# Patient Record
Sex: Female | Born: 2000
Health system: Southern US, Community
[De-identification: ages and names within clinical notes are randomized; demographics above are authoritative.]

## PROBLEM LIST (undated history)

## (undated) HISTORY — PX: NO PAST SURGERIES: SHX2092

---
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
## 2022-08-13 ENCOUNTER — Other Ambulatory Visit: Payer: Self-pay | Admitting: Nurse Practitioner

## 2022-08-13 NOTE — Telephone Encounter (Signed)
Requested medication (s) are due for refill today:   Yes  Requested medication (s) are on the active medication list:   Yes  Future visit scheduled:   No   Last ordered: 09/10/2021 #84, 3 refills  Returned because there is a note this was discontinued 09/10/2021.    Wasn't sure if she is still on this or not.   Requested Prescriptions  Pending Prescriptions Disp Refills   drospirenone-ethinyl estradiol (YAZ) 3-0.02 MG tablet [Pharmacy Med Name: DROSPIRENONE/ETHY EST 3/0.02MG  T 28] 84 tablet 3    Sig: Take 1 tablet by mouth daily.     OB/GYN:  Contraceptives Failed - 08/13/2022  9:15 AM      Failed - Valid encounter within last 12 months    Recent Outpatient Visits           1 year ago Encounter for contraceptive management, unspecified type   [place], NP              Passed - Last BP in normal range    BP Readings from Last 1 Encounters:  06/19/21 113/73         Passed - Patient is not a smoker

## 2022-09-08 ENCOUNTER — Ambulatory Visit
Admission: EM | Admit: 2022-09-08 | Discharge: 2022-09-08 | Disposition: A | Discharge status: Home / Self Care | Payer: 59 | Attending: Urgent Care | Admitting: Urgent Care

## 2022-09-08 DIAGNOSIS — T148XXA Other injury of unspecified body region, initial encounter: Secondary | ICD-10-CM | POA: Diagnosis not present

## 2022-09-08 DIAGNOSIS — Z23 Encounter for immunization: Secondary | ICD-10-CM

## 2022-09-08 MED ORDER — TETANUS-DIPHTH-ACELL PERTUSSIS 5-2.5-18.5 LF-MCG/0.5 IM SUSY
0.5000 mL | PREFILLED_SYRINGE | Freq: Once | INTRAMUSCULAR | Status: AC
  Administered 2022-09-08: 0.5 mL via INTRAMUSCULAR

## 2022-09-08 NOTE — ED Triage Notes (Signed)
Patient presents to UC for a fall today that happen an hr ago. Has a  puncture to left sheen. Mom states does not known when last Tdap was given. Washed area with soap and water.

## 2022-09-08 NOTE — ED Provider Notes (Signed)
Renaldo Fiddler    CSN: [id number] Arrival date & time: 09/08/22  1642      History   Chief Complaint Chief Complaint  Patient presents with   Fall   Abrasion    HPI Elizabeth [name] is a 22 y.o. female.    Fall    Companied by her mom, presents to urgent care after a fall today.  She endorses puncture wound to left shin after she fell into a "pothole".  Does not know when last tetanus occurred.  Patient washed the area with soap and water.  History reviewed. No pertinent past medical history.  There are no problems to display for this patient.   Past Surgical History:  Procedure Laterality Date   NO PAST SURGERIES      OB History   No obstetric history on file.      Home Medications    Prior to Admission medications   Medication Sig Start Date End Date Taking? Authorizing Provider  norgestimate-ethinyl estradiol (SPRINTEC 28) 0.25-35 MG-MCG tablet Take 1 tablet by mouth daily. 09/10/21   Larae Grooms, NP    Family History Family History  Problem Relation Age of Onset   Hypertension Mother    Healthy Father     Social History Social History   Tobacco Use   Smoking status: Never   Smokeless tobacco: Never  Vaping Use   Vaping Use: Never used  Substance Use Topics   Alcohol use: Never   Drug use: Never     Allergies   Patient has no known allergies.   Review of Systems Review of Systems   Physical Exam Triage Vital Signs ED Triage Vitals  Enc Vitals Group     BP 09/08/22 1720 127/79     Pulse Rate 09/08/22 1720 72     Resp 09/08/22 1720 16     Temp 09/08/22 1720 99.3 F (37.4 C)     Temp Source 09/08/22 1720 Oral     SpO2 09/08/22 1720 97 %     Weight --      Height --      Head Circumference --      Peak Flow --      Pain Score 09/08/22 1704 0     Pain Loc --      Pain Edu? --      Excl. in GC? --    No data found.  Updated Vital Signs BP 127/79 (BP Location: Left Arm)   Pulse 72   Temp 99.3 F (37.4 C)  (Oral)   Resp 16   LMP 08/18/2022 (Approximate)   SpO2 97%   Visual Acuity Right Eye Distance:   Left Eye Distance:   Bilateral Distance:    Right Eye Near:   Left Eye Near:    Bilateral Near:     Physical Exam Vitals reviewed.  Constitutional:      Appearance: Normal appearance.  Musculoskeletal:       Legs:  Skin:    General: Skin is warm and dry.  Neurological:     General: No focal deficit present.     Mental Status: She is alert and oriented to person, place, and time.  Psychiatric:        Mood and Affect: Mood normal.        Behavior: Behavior normal.      UC Treatments / Results  Labs (all labs ordered are listed, but only abnormal results are displayed) Labs Reviewed - No data to display  EKG   Radiology No results found.  Procedures Wound Care  Date/Time: 09/08/2022 6:24 PM  Performed by: Charma Igo, FNP Authorized by: Charma Igo, FNP   Consent:    Consent obtained:  Verbal   Consent given by:  Patient   Risks, benefits, and alternatives were discussed: yes     Risks discussed:  Pain   Alternatives discussed:  No treatment Universal protocol:    Procedure explained and questions answered to patient or proxy's satisfaction: yes     Relevant documents present and verified: yes     Test results available: no     Imaging studies available: no     Required blood products, implants, devices, and special equipment available: no     Site/side marked: no     Immediately prior to procedure, a time out was called: yes     Patient identity confirmed:  Verbally with patient Anesthesia:    Anesthesia method:  Topical application   Topical anesthetic:  LET Procedure details:    Indications: open wounds     Wound location:  Leg   Leg location:  L lower leg   Wound age (days):  <1   Wound surface area (sq cm):  1   Debridement performed: No   Skin layer closed with:    Wound care performed:  Nothing Dressing:    Dressing applied:   2x2 and Vaseline gauze   Wrapped with:  Conform bandage 3 inch Post-procedure details:    Procedure completion:  Tolerated Comments:     Wound irrigated with 20 ml NS.  (including critical care time)  Medications Ordered in UC Medications  Tdap (BOOSTRIX) injection 0.5 mL (0.5 mLs Intramuscular Given 09/08/22 1808)    Initial Impression / Assessment and Plan / UC Course  I have reviewed the triage vital signs and the nursing notes.  Pertinent labs & imaging results that were available during my care of the patient were reviewed by me and considered in my medical decision making (see chart for details).   Wound cleaned and dressed.  See procedure note.  Instructions for wound care provided to patient and mom.   Final Clinical Impressions(s) / UC Diagnoses   Final diagnoses:  Puncture wound   Discharge Instructions   None    ED Prescriptions   None    PDMP not reviewed this encounter.   Charma Igo, [place] 09/08/22 1826

## 2022-09-08 NOTE — Discharge Instructions (Addendum)
Formal wound care as described in clinic.  Watch for signs and symptoms of infection.  Follow up here or with your primary care provider if your symptoms are worsening or not improving.

## 2022-11-02 ENCOUNTER — Telehealth: Payer: Self-pay

## 2022-11-02 ENCOUNTER — Other Ambulatory Visit: Payer: Self-pay | Admitting: Nurse Practitioner

## 2022-11-02 NOTE — Telephone Encounter (Signed)
LVM for patient to call back [phone], or to call PCP office to schedule follow up apt. AS, CMA  

## 2022-11-03 NOTE — Telephone Encounter (Signed)
Requested medication (s) are due for refill today -expired Rx  Requested medication (s) are on the active medication list -yes  Future visit scheduled -no  Last refill: 09/10/21  Notes to clinic: expired Rx- attempted to contact patient for appointment- left message to call office.   Requested Prescriptions  Pending Prescriptions Disp Refills   MILI 0.25-35 MG-MCG tablet [Pharmacy Med Name: MILI TABLETS 28S] 84 tablet 3    Sig: TAKE 1 TABLET BY MOUTH DAILY     OB/GYN:  Contraceptives Failed - 11/02/2022  4:21 PM      Failed - Valid encounter within last 12 months    Recent Outpatient Visits           1 year ago Encounter for contraceptive management, unspecified type   [place] [facility] Larae Grooms, NP              Passed - Last BP in normal range    BP Readings from Last 1 Encounters:  09/08/22 127/79         Passed - Patient is not a smoker         Requested Prescriptions  Pending Prescriptions Disp Refills   MILI 0.25-35 MG-MCG tablet [Pharmacy Med Name: MILI TABLETS 28S] 84 tablet 3    Sig: TAKE 1 TABLET BY MOUTH DAILY     OB/GYN:  Contraceptives Failed - 11/02/2022  4:21 PM      Failed - Valid encounter within last 12 months    Recent Outpatient Visits           1 year ago Encounter for contraceptive management, unspecified type   [place] [facility] Larae Grooms, NP              Passed - Last BP in normal range    BP Readings from Last 1 Encounters:  09/08/22 127/79         Passed - Patient is not a smoker

## 2022-11-04 ENCOUNTER — Ambulatory Visit (INDEPENDENT_AMBULATORY_CARE_PROVIDER_SITE_OTHER): Payer: 59 | Admitting: Nurse Practitioner

## 2022-11-04 ENCOUNTER — Encounter: Payer: Self-pay | Admitting: Nurse Practitioner

## 2022-11-04 VITALS — BP 126/79 | HR 74 | Temp 97.9°F | Wt 172.4 lb

## 2022-11-04 DIAGNOSIS — Z23 Encounter for immunization: Secondary | ICD-10-CM | POA: Diagnosis not present

## 2022-11-04 DIAGNOSIS — Z0000 Encounter for general adult medical examination without abnormal findings: Secondary | ICD-10-CM | POA: Diagnosis not present

## 2022-11-04 MED ORDER — NORGESTIMATE-ETH ESTRADIOL 0.25-35 MG-MCG PO TABS: 1.0000 | ORAL_TABLET | Freq: Every day | ORAL | 3 refills | Status: DC

## 2022-11-04 NOTE — Progress Notes (Signed)
BP 126/79   Pulse 74   Temp 97.9 F (36.6 C) (Oral)   Wt 172 lb 6.4 oz (78.2 kg)   LMP 10/26/2022 (Approximate)   SpO2 100%   BMI 24.46 kg/m    Subjective:    Patient ID: Elizabeth [name], female    DOB: [birth year], 22 y.o.   MRN: [id number]  HPI: Elizabeth [name] is a 22 y.o. female presenting on 11/04/2022 for comprehensive medical examination. Current medical complaints include:none  She currently lives with: Menopausal Symptoms: no   Denies HA, CP, SOB, dizziness, palpitations, visual changes, and lower extremity swelling.  Depression Screen done today and results listed below:     11/04/2022    3:51 PM 06/19/2021    1:35 PM  Depression screen PHQ 2/9  Decreased Interest 0 0  Down, Depressed, Hopeless 0 0  PHQ - 2 Score 0 0  Altered sleeping 0 0  Tired, decreased energy 0 0  Change in appetite 0 0  Feeling bad or failure about yourself  0 0  Trouble concentrating 0 0  Moving slowly or fidgety/restless 0 0  Suicidal thoughts 0 0  PHQ-9 Score 0 0  Difficult doing work/chores Not difficult at all Not difficult at all    The patient does not have a history of falls. I did complete a risk assessment for falls. A plan of care for falls was documented.   Past Medical History:  History reviewed. No pertinent past medical history.  Surgical History:  Past Surgical History:  Procedure Laterality Date   NO PAST SURGERIES      Medications:  No current outpatient medications on file prior to visit.   No current facility-administered medications on file prior to visit.    Allergies:  No Known Allergies  Social History:  Social History   Socioeconomic History   Marital status: Single    Spouse name: Not on file   Number of children: Not on file   Years of education: Not on file   Highest education level: Not on file  Occupational History   Not on file  Tobacco Use   Smoking status: Never   Smokeless tobacco: Never  Vaping Use   Vaping Use: Never used   Substance and Sexual Activity   Alcohol use: Never   Drug use: Never   Sexual activity: Yes  Other Topics Concern   Not on file  Social History Narrative   Not on file   Social Determinants of Health   Financial Resource Strain: Not on file  Food Insecurity: Not on file  Transportation Needs: Not on file  Physical Activity: Not on file  Stress: Not on file  Social Connections: Not on file  Intimate Partner Violence: Not on file   Social History   Tobacco Use  Smoking Status Never  Smokeless Tobacco Never   Social History   Substance and Sexual Activity  Alcohol Use Never    Family History:  Family History  Problem Relation Age of Onset   Hypertension Mother    Healthy Father     Past medical history, surgical history, medications, allergies, family history and social history reviewed with patient today and changes made to appropriate areas of the chart.   Review of Systems  Eyes:  Negative for blurred vision and double vision.  Respiratory:  Negative for shortness of breath.   Cardiovascular:  Negative for chest pain, palpitations and leg swelling.  Neurological:  Negative for dizziness and headaches.   All  other ROS negative except what is listed above and in the HPI.      Objective:    BP 126/79   Pulse 74   Temp 97.9 F (36.6 C) (Oral)   Wt 172 lb 6.4 oz (78.2 kg)   LMP 10/26/2022 (Approximate)   SpO2 100%   BMI 24.46 kg/m   Wt Readings from Last 3 Encounters:  11/04/22 172 lb 6.4 oz (78.2 kg) (92 %, Z= 1.43)*  06/19/21 163 lb 12.8 oz (74.3 kg) (91 %, Z= 1.32)*  05/24/19 141 lb 3.2 oz (64 kg) (80 %, Z= 0.85)*   * Growth percentiles are based on CDC (Girls, 2-20 Years) data.    Physical Exam Vitals and nursing note reviewed.  Constitutional:      General: She is awake. She is not in acute distress.    Appearance: Normal appearance. She is well-developed. She is not ill-appearing.  HENT:     Head: Normocephalic and atraumatic.     Right  Ear: Hearing, tympanic membrane, ear canal and external ear normal. No drainage.     Left Ear: Hearing, tympanic membrane, ear canal and external ear normal. No drainage.     Nose: Nose normal.     Right Sinus: No maxillary sinus tenderness or frontal sinus tenderness.     Left Sinus: No maxillary sinus tenderness or frontal sinus tenderness.     Mouth/Throat:     Mouth: Mucous membranes are moist.     Pharynx: Oropharynx is clear. Uvula midline. No pharyngeal swelling, oropharyngeal exudate or posterior oropharyngeal erythema.  Eyes:     General: Lids are normal.        Right eye: No discharge.        Left eye: No discharge.     Extraocular Movements: Extraocular movements intact.     Conjunctiva/sclera: Conjunctivae normal.     Pupils: Pupils are equal, round, and reactive to light.     Visual Fields: Right eye visual fields normal and left eye visual fields normal.  Neck:     Thyroid: No thyromegaly.     Vascular: No carotid bruit.     Trachea: Trachea normal.  Cardiovascular:     Rate and Rhythm: Normal rate and regular rhythm.     Heart sounds: Normal heart sounds. No murmur heard.    No gallop.  Pulmonary:     Effort: Pulmonary effort is normal. No accessory muscle usage or respiratory distress.     Breath sounds: Normal breath sounds.  Chest:  Breasts:    Right: Normal.     Left: Normal.  Abdominal:     General: Bowel sounds are normal.     Palpations: Abdomen is soft. There is no hepatomegaly or splenomegaly.     Tenderness: There is no abdominal tenderness.  Musculoskeletal:        General: Normal range of motion.     Cervical back: Normal range of motion and neck supple.     Right lower leg: No edema.     Left lower leg: No edema.  Lymphadenopathy:     Head:     Right side of head: No submental, submandibular, tonsillar, preauricular or posterior auricular adenopathy.     Left side of head: No submental, submandibular, tonsillar, preauricular or posterior  auricular adenopathy.     Cervical: No cervical adenopathy.     Upper Body:     Right upper body: No supraclavicular, axillary or pectoral adenopathy.     Left upper body: No  supraclavicular, axillary or pectoral adenopathy.  Skin:    General: Skin is warm and dry.     Capillary Refill: Capillary refill takes less than 2 seconds.     Findings: No rash.  Neurological:     Mental Status: She is alert and oriented to person, place, and time.     Gait: Gait is intact.  Psychiatric:        Attention and Perception: Attention normal.        Mood and Affect: Mood normal.        Speech: Speech normal.        Behavior: Behavior normal. Behavior is cooperative.        Thought Content: Thought content normal.        Judgment: Judgment normal.     Results for orders placed or performed in visit on 06/19/21  Pregnancy, urine  Result Value Ref Range   Preg Test, Ur Negative Negative      Assessment & Plan:   Problem List Items Addressed This Visit   None Visit Diagnoses     Annual physical exam    -  Primary   Health maintenance reviewed during visit today.  Reviewed vaccines. HPV given.  TDAP up to date.   Need for HPV vaccination       Relevant Orders   HPV 9-valent vaccine,Recombinat        Follow up plan: Return in about 1 year (around 11/04/2023) for Physical and Fasting labs.   LABORATORY TESTING:  - Pap smear: not applicable  IMMUNIZATIONS:   - Tdap: Tetanus vaccination status reviewed: last tetanus booster within 10 years. - Influenza: Postponed to flu season - Pneumovax: Not applicable - Prevnar: Not applicable - COVID: Not applicable - HPV: Administered today - Shingrix vaccine: Not applicable  SCREENING: -Mammogram: Not applicable  - Colonoscopy: Not applicable  - Bone Density: Not applicable  -Hearing Test: Not applicable  -Spirometry: Not applicable   PATIENT COUNSELING:   Advised to take 1 mg of folate supplement per day if capable of pregnancy.    Sexuality: Discussed sexually transmitted diseases, partner selection, use of condoms, avoidance of unintended pregnancy  and contraceptive alternatives.   Advised to avoid cigarette smoking.  I discussed with the patient that most people either abstain from alcohol or drink within safe limits (<=14/week and <=4 drinks/occasion for males, <=7/weeks and <= 3 drinks/occasion for females) and that the risk for alcohol disorders and other health effects rises proportionally with the number of drinks per week and how often a drinker exceeds daily limits.  Discussed cessation/primary prevention of drug use and availability of treatment for abuse.   Diet: Encouraged to adjust caloric intake to maintain  or achieve ideal body weight, to reduce intake of dietary saturated fat and total fat, to limit sodium intake by avoiding high sodium foods and not adding table salt, and to maintain adequate dietary potassium and calcium preferably from fresh fruits, vegetables, and low-fat dairy products.    stressed the importance of regular exercise  Injury prevention: Discussed safety belts, safety helmets, smoke detector, smoking near bedding or upholstery.   Dental health: Discussed importance of regular tooth brushing, flossing, and dental visits.    NEXT PREVENTATIVE PHYSICAL DUE IN 1 YEAR. Return in about 1 year (around 11/04/2023) for Physical and Fasting labs.

## 2023-02-25 ENCOUNTER — Ambulatory Visit
Admission: EM | Admit: 2023-02-25 | Discharge: 2023-02-25 | Disposition: A | Discharge status: Home / Self Care | Payer: 59 | Attending: Emergency Medicine | Admitting: Emergency Medicine

## 2023-02-25 DIAGNOSIS — R1030 Lower abdominal pain, unspecified: Secondary | ICD-10-CM | POA: Diagnosis not present

## 2023-02-25 DIAGNOSIS — Z113 Encounter for screening for infections with a predominantly sexual mode of transmission: Secondary | ICD-10-CM

## 2023-02-25 DIAGNOSIS — R300 Dysuria: Secondary | ICD-10-CM | POA: Diagnosis not present

## 2023-02-25 LAB — POCT URINALYSIS DIP (MANUAL ENTRY)
Bilirubin, UA: NEGATIVE
Glucose, UA: NEGATIVE mg/dL
Ketones, POC UA: NEGATIVE mg/dL
Nitrite, UA: NEGATIVE
Protein Ur, POC: NEGATIVE mg/dL
Spec Grav, UA: 1.02 (ref 1.010–1.025)
Urobilinogen, UA: 0.2 U/dL
pH, UA: 6 (ref 5.0–8.0)

## 2023-02-25 LAB — POCT URINE PREGNANCY: Preg Test, Ur: NEGATIVE

## 2023-02-25 MED ORDER — CEPHALEXIN 500 MG PO CAPS: 500.0000 mg | ORAL_CAPSULE | Freq: Two times a day (BID) | ORAL | 0 refills | Status: AC

## 2023-02-25 NOTE — Discharge Instructions (Addendum)
Take the antibiotic as directed.  The urine culture is pending.  We will call you if it shows the need to change or discontinue your antibiotic.    Your vaginal tests are pending.  If your test results are positive, we will call you.  You may require treatment at that time.  Do not have sexual activity for at least 7 days.    Follow up with your primary care provider if your symptoms are not improving.

## 2023-02-25 NOTE — ED Triage Notes (Signed)
Pt states she is having urinary frequency, burning with urination, lower abdominal cramping that started Sunday. Taking cranberry pills with some relief of symptoms.

## 2023-02-25 NOTE — ED Provider Notes (Signed)
Renaldo Fiddler    CSN: [id number] Arrival date & time: 02/25/23  1648      History   Chief Complaint Chief Complaint  Patient presents with   Urinary Frequency    HPI Elizabeth [name] is a 22 y.o. female.  Patient presents with 5-day history of dysuria, urinary frequency, lower abdominal pain.  Treating with cranberry pills.  Patient requests urine check and STD testing.  She is unsure about vaginal discharge as she is currently on her menstrual cycle.  She is sexually active.  She denies fever, rash, vomiting, diarrhea, flank pain, pelvic pain, or other symptoms.  She denies pertinent medical history.  The history is provided by the patient and medical records.    History reviewed. No pertinent past medical history.  There are no problems to display for this patient.   Past Surgical History:  Procedure Laterality Date   NO PAST SURGERIES      OB History   No obstetric history on file.      Home Medications    Prior to Admission medications   Medication Sig Start Date End Date Taking? Authorizing Provider  cephALEXin (KEFLEX) 500 MG capsule Take 1 capsule (500 mg total) by mouth 2 (two) times daily for 5 days. 02/25/23 03/02/23 Yes Mickie Bail, NP  norgestimate-ethinyl estradiol (SPRINTEC 28) 0.25-35 MG-MCG tablet Take 1 tablet by mouth daily. 11/04/22  Yes Larae Grooms, NP    Family History Family History  Problem Relation Age of Onset   Hypertension Mother    Healthy Father     Social History Social History   Tobacco Use   Smoking status: Never   Smokeless tobacco: Never  Vaping Use   Vaping status: Never Used  Substance Use Topics   Alcohol use: Never   Drug use: Never     Allergies   Patient has no known allergies.   Review of Systems Review of Systems  Constitutional:  Negative for chills and fever.  Gastrointestinal:  Positive for abdominal pain. Negative for constipation, diarrhea, nausea and vomiting.  Genitourinary:   Positive for dysuria and frequency. Negative for flank pain, hematuria, pelvic pain and vaginal discharge.  Skin:  Negative for color change and rash.     Physical Exam Triage Vital Signs ED Triage Vitals  Encounter Vitals Group     BP 02/25/23 1730 137/70     Systolic BP Percentile --      Diastolic BP Percentile --      Pulse Rate 02/25/23 1730 88     Resp 02/25/23 1730 16     Temp 02/25/23 1730 98.2 F (36.8 C)     Temp Source 02/25/23 1730 Oral     SpO2 02/25/23 1730 98 %     Weight --      Height --      Head Circumference --      Peak Flow --      Pain Score 02/25/23 1731 0     Pain Loc --      Pain Education --      Exclude from Growth Chart --    No data found.  Updated Vital Signs BP 137/70 (BP Location: Left Arm)   Pulse 88   Temp 98.2 F (36.8 C) (Oral)   Resp 16   LMP 02/25/2023 (Exact Date)   SpO2 98%   Visual Acuity Right Eye Distance:   Left Eye Distance:   Bilateral Distance:    Right Eye Near:  Left Eye Near:    Bilateral Near:     Physical Exam Constitutional:      General: She is not in acute distress. HENT:     Mouth/Throat:     Mouth: Mucous membranes are moist.  Cardiovascular:     Rate and Rhythm: Normal rate and regular rhythm.  Pulmonary:     Effort: Pulmonary effort is normal. No respiratory distress.  Abdominal:     General: Bowel sounds are normal.     Palpations: Abdomen is soft.     Tenderness: There is no abdominal tenderness. There is no right CVA tenderness, left CVA tenderness, guarding or rebound.  Skin:    General: Skin is warm and dry.  Neurological:     Mental Status: She is alert.  Psychiatric:        Mood and Affect: Mood normal.        Behavior: Behavior normal.      UC Treatments / Results  Labs (all labs ordered are listed, but only abnormal results are displayed) Labs Reviewed  POCT URINALYSIS DIP (MANUAL ENTRY) - Abnormal; Notable for the following components:      Result Value   Clarity, UA  cloudy (*)    Blood, UA large (*)    Leukocytes, UA Small (1+) (*)    All other components within normal limits  URINE CULTURE  POCT URINE PREGNANCY  CERVICOVAGINAL ANCILLARY ONLY    EKG   Radiology No results found.  Procedures Procedures (including critical care time)  Medications Ordered in UC Medications - No data to display  Initial Impression / Assessment and Plan / UC Course  I have reviewed the triage vital signs and the nursing notes.  Pertinent labs & imaging results that were available during my care of the patient were reviewed by me and considered in my medical decision making (see chart for details).    Dysuria, lower abdominal pain, STD screening.  Treating with Keflex. Urine culture pending. Discussed with patient that we will call her if the urine culture shows the need to change or discontinue the antibiotic.  Patient obtained vaginal self swab for testing.  Discussed that we will call if test results are positive and that she may require treatment at that time.  Instructed patient to abstain from sexual activity for at least 7 days.  Instructed her to follow-up with her PCP if her symptoms are not improving.  Patient agrees to plan of care.      Final Clinical Impressions(s) / UC Diagnoses   Final diagnoses:  Screening for STD (sexually transmitted disease)  Dysuria  Lower abdominal pain     Discharge Instructions      Take the antibiotic as directed.  The urine culture is pending.  We will call you if it shows the need to change or discontinue your antibiotic.    Your vaginal tests are pending.  If your test results are positive, we will call you.  You may require treatment at that time.  Do not have sexual activity for at least 7 days.    Follow-up with your primary care provider if your symptoms are not improving.        ED Prescriptions     Medication Sig Dispense Auth. Provider   cephALEXin (KEFLEX) 500 MG capsule Take 1 capsule (500 mg  total) by mouth 2 (two) times daily for 5 days. 10 capsule Mickie Bail, NP      PDMP not reviewed this encounter.  Mickie Bail, NP 02/25/23 1754

## 2023-02-26 LAB — CERVICOVAGINAL ANCILLARY ONLY
Bacterial Vaginitis (gardnerella): NEGATIVE
Candida Glabrata: NEGATIVE
Candida Vaginitis: NEGATIVE
Chlamydia: NEGATIVE
Comment: NEGATIVE
Comment: NEGATIVE
Comment: NEGATIVE
Comment: NEGATIVE
Comment: NEGATIVE
Comment: NORMAL
Neisseria Gonorrhea: NEGATIVE
Trichomonas: NEGATIVE

## 2023-02-28 LAB — URINE CULTURE: Culture: >=100000 — AB

## 2023-11-02 ENCOUNTER — Other Ambulatory Visit: Payer: Self-pay | Admitting: Nurse Practitioner

## 2023-11-04 NOTE — Telephone Encounter (Signed)
 Requested Prescriptions  Pending Prescriptions Disp Refills   norgestimate -ethinyl estradiol  (MILI) 0.25-35 MG-MCG tablet [Pharmacy Med Name: MILI TABLETS 28S] 84 tablet 0    Sig: TAKE 1 TABLET BY MOUTH DAILY     OB/GYN:  Contraceptives Failed - 11/04/2023  3:55 PM      Failed - Valid encounter within last 12 months    Recent Outpatient Visits   None     Future Appointments             In 4 days Aileen Alexanders, NP [facility]            Passed - Last BP in normal range    BP Readings from Last 1 Encounters:  02/25/23 137/70         Passed - Patient is not a smoker

## 2023-11-08 ENCOUNTER — Encounter: Payer: Self-pay | Admitting: Nurse Practitioner

## 2023-11-08 NOTE — Progress Notes (Deleted)
 There were no vitals taken for this visit.   Subjective:    Patient ID: Elizabeth [name], female    DOB: 10-29-2000, 23 y.o.   MRN: [id number]  HPI: JURLINE FOLGER is a 23 y.o. female presenting on 11/08/2023 for comprehensive medical examination. Current medical complaints include:{Blank single:19197::none,***}  She currently lives with: Menopausal Symptoms: {Blank single:19197::yes,no}  Depression Screen done today and results listed below:     11/04/2022    3:51 PM 06/19/2021    1:35 PM  Depression screen PHQ 2/9  Decreased Interest 0 0  Down, Depressed, Hopeless 0 0  PHQ - 2 Score 0 0  Altered sleeping 0 0  Tired, decreased energy 0 0  Change in appetite 0 0  Feeling bad or failure about yourself  0 0  Trouble concentrating 0 0  Moving slowly or fidgety/restless 0 0  Suicidal thoughts 0 0  PHQ-9 Score 0 0  Difficult doing work/chores Not difficult at all Not difficult at all    The patient {has/does not have:19849} a history of falls. I {did/did not:19850} complete a risk assessment for falls. A plan of care for falls {was/was not:19852} documented.   Past Medical History:  No past medical history on file.  Surgical History:  Past Surgical History:  Procedure Laterality Date   NO PAST SURGERIES      Medications:  Current Outpatient Medications on File Prior to Visit  Medication Sig   norgestimate -ethinyl estradiol  (MILI) 0.25-35 MG-MCG tablet TAKE 1 TABLET BY MOUTH DAILY   No current facility-administered medications on file prior to visit.    Allergies:  No Known Allergies  Social History:  Social History   Socioeconomic History   Marital status: Single    Spouse name: Not on file   Number of children: Not on file   Years of education: Not on file   Highest education level: Not on file  Occupational History   Not on file  Tobacco Use   Smoking status: Never   Smokeless tobacco: Never  Vaping Use   Vaping status: Never Used  Substance and  Sexual Activity   Alcohol use: Never   Drug use: Never   Sexual activity: Yes  Other Topics Concern   Not on file  Social History Narrative   Not on file   Social Drivers of Health   Financial Resource Strain: Not on file  Food Insecurity: Not on file  Transportation Needs: Not on file  Physical Activity: Not on file  Stress: Not on file  Social Connections: Not on file  Intimate Partner Violence: Not on file   Social History   Tobacco Use  Smoking Status Never  Smokeless Tobacco Never   Social History   Substance and Sexual Activity  Alcohol Use Never    Family History:  Family History  Problem Relation Age of Onset   Hypertension Mother    Healthy Father     Past medical history, surgical history, medications, allergies, family history and social history reviewed with patient today and changes made to appropriate areas of the chart.   ROS All other ROS negative except what is listed above and in the HPI.      Objective:    There were no vitals taken for this visit.  Wt Readings from Last 3 Encounters:  11/04/22 172 lb 6.4 oz (78.2 kg) (92%, Z= 1.43)*  06/19/21 163 lb 12.8 oz (74.3 kg) (91%, Z= 1.32)*  05/24/19 141 lb 3.2 oz (64 kg) (80%,  Z= 0.85)*   * Growth percentiles are based on CDC (Girls, 2-20 Years) data.    Physical Exam  Results for orders placed or performed during the hospital encounter of 02/25/23  POCT urinalysis dipstick   Collection Time: 02/25/23  5:37 PM  Result Value Ref Range   Color, UA yellow yellow   Clarity, UA cloudy (A) clear   Glucose, UA negative negative mg/dL   Bilirubin, UA negative negative   Ketones, POC UA negative negative mg/dL   Spec Grav, UA 8.979 8.989 - 1.025   Blood, UA large (A) negative   pH, UA 6.0 5.0 - 8.0   Protein Ur, POC negative negative mg/dL   Urobilinogen, UA 0.2 0.2 or 1.0 E.U./dL   Nitrite, UA Negative Negative   Leukocytes, UA Small (1+) (A) Negative  POCT urine pregnancy   Collection  Time: 02/25/23  5:37 PM  Result Value Ref Range   Preg Test, Ur Negative Negative  Cervicovaginal ancillary only   Collection Time: 02/25/23  5:38 PM  Result Value Ref Range   Neisseria Gonorrhea Negative    Chlamydia Negative    Trichomonas Negative    Bacterial Vaginitis (gardnerella) Negative    Candida Vaginitis Negative    Candida Glabrata Negative    Comment      Normal Reference Range Bacterial Vaginosis - Negative   Comment Normal Reference Range Candida Species - Negative    Comment Normal Reference Range Candida Galbrata - Negative    Comment Normal Reference Range Trichomonas - Negative    Comment Normal Reference Ranger Chlamydia - Negative    Comment      Normal Reference Range Neisseria Gonorrhea - Negative  Urine Culture   Collection Time: 02/25/23  5:43 PM   Specimen: Urine, Clean Catch  Result Value Ref Range   Specimen Description URINE, CLEAN CATCH    Special Requests      NONE Performed at [facility], [address], [place], [place] [address]    Culture >=100,000 COLONIES/mL ESCHERICHIA COLI (A)    Report Status 02/28/2023 FINAL    Organism ID, Bacteria ESCHERICHIA COLI (A)       Susceptibility   Escherichia coli - MIC*    AMPICILLIN 8 SENSITIVE Sensitive     CEFAZOLIN <=4 SENSITIVE Sensitive     CEFEPIME <=0.12 SENSITIVE Sensitive     CEFTRIAXONE <=0.25 SENSITIVE Sensitive     CIPROFLOXACIN <=0.25 SENSITIVE Sensitive     GENTAMICIN <=1 SENSITIVE Sensitive     IMIPENEM <=0.25 SENSITIVE Sensitive     NITROFURANTOIN 32 SENSITIVE Sensitive     TRIMETH/SULFA <=20 SENSITIVE Sensitive     AMPICILLIN/SULBACTAM 4 SENSITIVE Sensitive     PIP/TAZO <=4 SENSITIVE Sensitive ug/mL    * >=100,000 COLONIES/mL ESCHERICHIA COLI      Assessment & Plan:   Problem List Items Addressed This Visit   None    Follow up plan: No follow-ups on file.   LABORATORY TESTING:  - Pap smear: {Blank single:19197::pap done,not applicable,up to date,done  elsewhere}  IMMUNIZATIONS:   - Tdap: Tetanus vaccination status reviewed: {tetanus status:315746}. - Influenza: {Blank single:19197::Up to date,Administered today,Postponed to flu season,Refused,Given elsewhere} - Pneumovax: {Blank single:19197::Up to date,Administered today,Not applicable,Refused,Given elsewhere} - Prevnar: {Blank single:19197::Up to date,Administered today,Not applicable,Refused,Given elsewhere} - COVID: {Blank single:19197::Up to date,Administered today,Not applicable,Refused,Given elsewhere} - HPV: {Blank single:19197::Up to date,Administered today,Not applicable,Refused,Given elsewhere} - Shingrix vaccine: {Blank single:19197::Up to date,Administered today,Not applicable,Refused,Given elsewhere}  SCREENING: -Mammogram: {Blank single:19197::Up to date,Ordered today,Not applicable,Refused,Done elsewhere}  -  Colonoscopy: {Blank single:19197::Up to date,Ordered today,Not applicable,Refused,Done elsewhere}  - Bone Density: {Blank single:19197::Up to date,Ordered today,Not applicable,Refused,Done elsewhere}  -Hearing Test: {Blank single:19197::Up to date,Ordered today,Not applicable,Refused,Done elsewhere}  -Spirometry: {Blank single:19197::Up to date,Ordered today,Not applicable,Refused,Done elsewhere}   PATIENT COUNSELING:   Advised to take 1 mg of folate supplement per day if capable of pregnancy.   Sexuality: Discussed sexually transmitted diseases, partner selection, use of condoms, avoidance of unintended pregnancy  and contraceptive alternatives.   Advised to avoid cigarette smoking.  I discussed with the patient that most people either abstain from alcohol or drink within safe limits (<=14/week and <=4 drinks/occasion for males, <=7/weeks and <= 3 drinks/occasion for females) and that the risk for alcohol disorders and other health effects rises  proportionally with the number of drinks per week and how often a drinker exceeds daily limits.  Discussed cessation/primary prevention of drug use and availability of treatment for abuse.   Diet: Encouraged to adjust caloric intake to maintain  or achieve ideal body weight, to reduce intake of dietary saturated fat and total fat, to limit sodium intake by avoiding high sodium foods and not adding table salt, and to maintain adequate dietary potassium and calcium preferably from fresh fruits, vegetables, and low-fat dairy products.    stressed the importance of regular exercise  Injury prevention: Discussed safety belts, safety helmets, smoke detector, smoking near bedding or upholstery.   Dental health: Discussed importance of regular tooth brushing, flossing, and dental visits.    NEXT PREVENTATIVE PHYSICAL DUE IN 1 YEAR. No follow-ups on file.

## 2023-11-25 ENCOUNTER — Telehealth: Payer: Self-pay

## 2023-11-25 NOTE — Telephone Encounter (Signed)
 Copied from CRM [id number]. Topic: General - Other >> Nov 25, 2023 12:17 PM Emylou G wrote: Reason for CRM: Need shot information, tb test, 2 step hep b, tetanus within 7 years, mmr, flu, varisella, rebellus.. pls review for patient

## 2023-12-08 ENCOUNTER — Ambulatory Visit (INDEPENDENT_AMBULATORY_CARE_PROVIDER_SITE_OTHER): Payer: Self-pay | Admitting: Nurse Practitioner

## 2023-12-08 ENCOUNTER — Encounter: Payer: Self-pay | Admitting: Nurse Practitioner

## 2023-12-08 VITALS — BP 113/75 | HR 80 | Temp 98.1°F | Ht 70.6 in | Wt 170.2 lb

## 2023-12-08 DIAGNOSIS — Z0289 Encounter for other administrative examinations: Secondary | ICD-10-CM | POA: Diagnosis not present

## 2023-12-08 DIAGNOSIS — Z111 Encounter for screening for respiratory tuberculosis: Secondary | ICD-10-CM | POA: Diagnosis not present

## 2023-12-08 NOTE — Progress Notes (Signed)
 BP 113/75   Pulse 80   Temp 98.1 F (36.7 C) (Oral)   Ht 5' 10.6 (1.793 m)   Wt 170 lb 3.2 oz (77.2 kg)   LMP 12/05/2023 (Exact Date)   SpO2 98%   BMI 24.01 kg/m    Subjective:    Patient ID: Elizabeth [name], female    DOB: 03/25/2001, 23 y.o.   MRN: [id number]  HPI: Elizabeth [name] is a 23 y.o. 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  She does not have a specific form that needs to be completed.  Does need titers and a quantiferon gold.   Relevant past medical, surgical, family and social history reviewed and updated as indicated. Interim medical history since our last visit reviewed. Allergies and medications reviewed and updated.  Review of Systems  All other systems reviewed and are negative.   Per HPI unless specifically indicated above     Objective:    BP 113/75   Pulse 80   Temp 98.1 F (36.7 C) (Oral)   Ht 5' 10.6 (1.793 m)   Wt 170 lb 3.2 oz (77.2 kg)   LMP 12/05/2023 (Exact Date)   SpO2 98%   BMI 24.01 kg/m   Wt Readings from Last 3 Encounters:  12/08/23 170 lb 3.2 oz (77.2 kg)  11/04/22 172 lb 6.4 oz (78.2 kg) (92%, Z= 1.43)*  06/19/21 163 lb 12.8 oz (74.3 kg) (91%, Z= 1.32)*   * Growth percentiles are based on CDC (Girls, 2-20 Years) data.    Physical Exam Vitals and nursing note reviewed.  Constitutional:      General: She is not in acute distress.    Appearance: Normal appearance. She is normal weight. She is not ill-appearing, toxic-appearing or diaphoretic.  HENT:     Head: Normocephalic.     Right Ear: External ear normal.     Left Ear: External ear normal.     Nose: Nose normal.     Mouth/Throat:     Mouth: Mucous membranes are moist.     Pharynx: Oropharynx is clear.  Eyes:     General:        Right eye: No discharge.        Left eye: No discharge.     Extraocular Movements: Extraocular movements intact.      Conjunctiva/sclera: Conjunctivae normal.     Pupils: Pupils are equal, round, and reactive to light.  Cardiovascular:     Rate and Rhythm: Normal rate and regular rhythm.     Heart sounds: No murmur heard. Pulmonary:     Effort: Pulmonary effort is normal. No respiratory distress.     Breath sounds: Normal breath sounds. No wheezing or rales.  Musculoskeletal:     Cervical back: Normal range of motion and neck supple.  Skin:    General: Skin is warm and dry.     Capillary Refill: Capillary refill takes less than 2 seconds.  Neurological:     General: No focal deficit present.     Mental Status: She is alert and oriented to person, place, and time. Mental status is at baseline.  Psychiatric:        Mood and Affect: Mood normal.        Behavior: Behavior normal.        Thought Content: Thought content normal.        Judgment: Judgment normal.  Results for orders placed or performed during the hospital encounter of 02/25/23  POCT urinalysis dipstick   Collection Time: 02/25/23  5:37 PM  Result Value Ref Range   Color, UA yellow yellow   Clarity, UA cloudy (A) clear   Glucose, UA negative negative mg/dL   Bilirubin, UA negative negative   Ketones, POC UA negative negative mg/dL   Spec Grav, UA 8.979 8.989 - 1.025   Blood, UA large (A) negative   pH, UA 6.0 5.0 - 8.0   Protein Ur, POC negative negative mg/dL   Urobilinogen, UA 0.2 0.2 or 1.0 E.U./dL   Nitrite, UA Negative Negative   Leukocytes, UA Small (1+) (A) Negative  POCT urine pregnancy   Collection Time: 02/25/23  5:37 PM  Result Value Ref Range   Preg Test, Ur Negative Negative  Cervicovaginal ancillary only   Collection Time: 02/25/23  5:38 PM  Result Value Ref Range   Neisseria Gonorrhea Negative    Chlamydia Negative    Trichomonas Negative    Bacterial Vaginitis (gardnerella) Negative    Candida Vaginitis Negative    Candida Glabrata Negative    Comment      Normal Reference Range Bacterial Vaginosis -  Negative   Comment Normal Reference Range Candida Species - Negative    Comment Normal Reference Range Candida Galbrata - Negative    Comment Normal Reference Range Trichomonas - Negative    Comment Normal Reference Ranger Chlamydia - Negative    Comment      Normal Reference Range Neisseria Gonorrhea - Negative  Urine Culture   Collection Time: 02/25/23  5:43 PM   Specimen: Urine, Clean Catch  Result Value Ref Range   Specimen Description URINE, CLEAN CATCH    Special Requests      NONE Performed at [facility], [address] [place], [place] [address]    Culture >=100,000 COLONIES/mL ESCHERICHIA COLI (A)    Report Status 02/28/2023 FINAL    Organism ID, Bacteria ESCHERICHIA COLI (A)       Susceptibility   Escherichia coli - MIC*    AMPICILLIN 8 SENSITIVE Sensitive     CEFAZOLIN <=4 SENSITIVE Sensitive     CEFEPIME <=0.12 SENSITIVE Sensitive     CEFTRIAXONE <=0.25 SENSITIVE Sensitive     CIPROFLOXACIN <=0.25 SENSITIVE Sensitive     GENTAMICIN <=1 SENSITIVE Sensitive     IMIPENEM <=0.25 SENSITIVE Sensitive     NITROFURANTOIN 32 SENSITIVE Sensitive     TRIMETH/SULFA <=20 SENSITIVE Sensitive     AMPICILLIN/SULBACTAM 4 SENSITIVE Sensitive     PIP/TAZO <=4 SENSITIVE Sensitive ug/mL    * >=100,000 COLONIES/mL ESCHERICHIA COLI      Assessment & Plan:   Problem List Items Addressed This Visit   None Visit Diagnoses       Encounter for completion of form with patient    -  Primary   MMR, Varicella and quantiferon gold checked at visit today.   Relevant Orders   Varicella zoster antibody, IgG   Measles/Mumps/Rubella Immunity   QuantiFERON-TB Gold Plus     Screening for tuberculosis       Relevant Orders   QuantiFERON-TB Gold Plus        Follow up plan: No follow-ups on file.

## 2023-12-12 LAB — MEASLES/MUMPS/RUBELLA IMMUNITY
MUMPS ABS, IGG: 94 [AU]/ml (ref >10.9)
RUBEOLA AB, IGG: >300 [AU]/ml (ref >16.4)
Rubella Antibodies, IGG: 1.28 {index} (ref >0.99)

## 2023-12-12 LAB — QUANTIFERON-TB GOLD PLUS
QuantiFERON Mitogen Value: >10 [IU]/mL
QuantiFERON Nil Value: 0.01 [IU]/mL
QuantiFERON TB1 Ag Value: 0.03 [IU]/mL
QuantiFERON TB2 Ag Value: 0.03 [IU]/mL
QuantiFERON-TB Gold Plus: NEGATIVE

## 2023-12-12 LAB — VARICELLA ZOSTER ANTIBODY, IGG: Varicella zoster IgG: REACTIVE

## 2023-12-13 ENCOUNTER — Ambulatory Visit: Payer: Self-pay | Admitting: Nurse Practitioner

## 2024-01-12 ENCOUNTER — Ambulatory Visit: Admitting: Pediatrics

## 2024-01-12 VITALS — BP 111/76 | HR 71 | Temp 97.5°F | Wt 169.8 lb

## 2024-01-12 DIAGNOSIS — Z0289 Encounter for other administrative examinations: Secondary | ICD-10-CM | POA: Diagnosis not present

## 2024-01-12 NOTE — Patient Instructions (Signed)
 Schedule physical when you can

## 2024-01-12 NOTE — Progress Notes (Signed)
 Office Visit  BP 111/76   Pulse 71   Temp (!) 97.5 F (36.4 C) (Oral)   Wt 169 lb 12.8 oz (77 kg)   LMP 12/08/2023 (Approximate)   SpO2 98%   BMI 23.95 kg/m    Subjective:    Patient ID: Elizabeth [name], female    DOB: 2000-11-11, 23 y.o.   MRN: [id number]  HPI: Elizabeth [name] is a 23 y.o. female  Chief Complaint  Patient presents with   Follow-up   #forms She is requesting forms filled out for pre-employment Had recent blood work done and TB testing Declines any deficits that would affect job function  Relevant past medical, surgical, family and social history reviewed and updated as indicated. Interim medical history since our last visit reviewed. Allergies and medications reviewed and updated.  ROS per HPI unless specifically indicated above     Objective:    BP 111/76   Pulse 71   Temp (!) 97.5 F (36.4 C) (Oral)   Wt 169 lb 12.8 oz (77 kg)   LMP 12/08/2023 (Approximate)   SpO2 98%   BMI 23.95 kg/m   Wt Readings from Last 3 Encounters:  01/12/24 169 lb 12.8 oz (77 kg)  12/08/23 170 lb 3.2 oz (77.2 kg)  11/04/22 172 lb 6.4 oz (78.2 kg) (92%, Z= 1.43)*   * Growth percentiles are based on CDC (Girls, 2-20 Years) data.     Physical Exam Constitutional:      Appearance: Normal appearance.  Pulmonary:     Effort: Pulmonary effort is normal.  Musculoskeletal:        General: Normal range of motion.  Skin:    Comments: Normal skin color  Neurological:     General: No focal deficit present.     Mental Status: She is alert. Mental status is at baseline.  Psychiatric:        Mood and Affect: Mood normal.        Behavior: Behavior normal.        Thought Content: Thought content normal.         01/12/2024    3:58 PM 11/04/2022    3:51 PM 06/19/2021    1:35 PM  Depression screen PHQ 2/9  Decreased Interest 0 0 0  Down, Depressed, Hopeless 0 0 0  PHQ - 2 Score 0 0 0  Altered sleeping 0 0 0  Tired, decreased energy 0 0 0  Change in appetite 0 0 0   Feeling bad or failure about yourself  0 0 0  Trouble concentrating 0 0 0  Moving slowly or fidgety/restless 0 0 0  Suicidal thoughts 0 0 0  PHQ-9 Score 0 0 0  Difficult doing work/chores Not difficult at all Not difficult at all Not difficult at all       01/12/2024    3:58 PM 11/04/2022    3:52 PM 06/19/2021    1:35 PM  GAD 7 : Generalized Anxiety Score  Nervous, Anxious, on Edge 0 0 0  Control/stop worrying 0 0 0  Worry too much - different things 0 0 0  Trouble relaxing 0 0 0  Restless 0 0 0  Easily annoyed or irritable 0 0 0  Afraid - awful might happen 0 0 0  Total GAD 7 Score 0 0 0  Anxiety Difficulty Not difficult at all Not difficult at all Not difficult at all       Assessment & Plan:  Assessment & Plan  Encounter for completion of form with patient Form completed. Reviewed recent neg TB test. No questions reported by patient. Created copy and sent to scan.   Follow up plan: Return if symptoms worsen or fail to improve.  Hadassah SHAUNNA Nett, MD

## 2024-01-18 ENCOUNTER — Encounter: Payer: Self-pay | Admitting: Pediatrics

## 2024-02-22 ENCOUNTER — Other Ambulatory Visit: Payer: Self-pay | Admitting: Nurse Practitioner

## 2024-02-22 ENCOUNTER — Ambulatory Visit: Admitting: Nurse Practitioner

## 2024-02-22 NOTE — Telephone Encounter (Unsigned)
 Copied from CRM [id number]. Topic: Clinical - Medication Refill >> Feb 22, 2024 12:07 PM Yolanda T wrote: Medication: norgestimate -ethinyl estradiol  (MILI) 0.25-35 MG-MCG tablet  Has the patient contacted their pharmacy? No  This is the patient's preferred pharmacy:  [id number] - [place], [place] - [address] AT [facility] [address] [facility] [address] [place] [address] Phone: [phone] Fax: [phone]  Is this the correct pharmacy for this prescription? Yes  Has the prescription been filled recently? Yes  Is the patient out of the medication? Yes  Has the patient been seen for an appointment in the last year OR does the patient have an upcoming appointment? Yes  Can we respond through MyChart? Yes  Agent: Please be advised that Rx refills may take up to 3 business days. We ask that you follow-up with your pharmacy.

## 2024-02-24 MED ORDER — NORGESTIMATE-ETH ESTRADIOL 0.25-35 MG-MCG PO TABS
1.0000 | ORAL_TABLET | Freq: Every day | ORAL | 0 refills | Status: AC
  Filled 2024-06-22: qty 56, 56d supply, fill #0
  Filled 2024-06-23: qty 28, 28d supply, fill #0
  Filled 2024-06-23: qty 56, 56d supply, fill #0

## 2024-02-24 NOTE — Telephone Encounter (Signed)
 Requested Prescriptions  Pending Prescriptions Disp Refills   norgestimate -ethinyl estradiol  (MILI) 0.25-35 MG-MCG tablet 84 tablet 0    Sig: Take 1 tablet by mouth daily.     OB/GYN:  Contraceptives Passed - 02/24/2024 10:29 AM      Passed - Last BP in normal range    BP Readings from Last 1 Encounters:  01/12/24 111/76         Passed - Valid encounter within last 12 months    Recent Outpatient Visits           1 month ago Encounter for completion of form with patient   [facility] Herold Hadassah SQUIBB, MD   2 months ago Encounter for completion of form with patient   [place] [facility] Melvin Pao, NP              Passed - Patient is not a smoker

## 2024-05-20 ENCOUNTER — Other Ambulatory Visit: Payer: Self-pay | Admitting: Nurse Practitioner

## 2024-05-22 NOTE — Telephone Encounter (Signed)
 Requested medication (s) are due for refill today: na   Requested medication (s) are on the active medication list: yes   Last refill:  02/24/24 #84 0 refills  Future visit scheduled: no   Notes to clinic:  no refills remain. Do you want to refill Rx?     Requested Prescriptions  Pending Prescriptions Disp Refills   ESTARYLLA 0.25-35 MG-MCG tablet [Pharmacy Med Name: ESTARYLLA TABLETS 28S] 84 tablet 0    Sig: TAKE 1 TABLET BY MOUTH DAILY     OB/GYN:  Contraceptives Passed - 05/22/2024  3:47 PM      Passed - Last BP in normal range    BP Readings from Last 1 Encounters:  01/12/24 111/76         Passed - Valid encounter within last 12 months    Recent Outpatient Visits           4 months ago Encounter for completion of form with patient   [place] [facility] Herold Hadassah SQUIBB, MD   5 months ago Encounter for completion of form with patient    [facility] Melvin Pao, NP              Passed - Patient is not a smoker

## 2024-06-22 ENCOUNTER — Other Ambulatory Visit: Payer: Self-pay

## 2024-06-23 ENCOUNTER — Other Ambulatory Visit: Payer: Self-pay

## 2024-06-23 ENCOUNTER — Other Ambulatory Visit: Payer: Self-pay | Admitting: Nurse Practitioner
